# Patient Record
Sex: Male | Born: 1990 | Hispanic: No | Marital: Single | Smoking: Former smoker
Health system: Southern US, Community
[De-identification: ages and names within clinical notes are randomized; demographics above are authoritative.]

---

## 2009-03-05 ENCOUNTER — Emergency Department (HOSPITAL_COMMUNITY): Admission: EM | Admit: 2009-03-05 | Discharge: 2009-03-05 | Payer: Self-pay | Admitting: Emergency Medicine

## 2010-05-15 LAB — RAPID STREP SCREEN (MED CTR MEBANE ONLY): Streptococcus, Group A Screen (Direct): NEGATIVE

## 2010-05-15 LAB — GLUCOSE, CAPILLARY: Glucose-Capillary: 121 mg/dL — ABNORMAL HIGH (ref 70–99)

## 2013-06-30 ENCOUNTER — Emergency Department (HOSPITAL_COMMUNITY): Payer: Self-pay

## 2013-06-30 ENCOUNTER — Emergency Department (HOSPITAL_COMMUNITY)
Admission: EM | Admit: 2013-06-30 | Discharge: 2013-06-30 | Disposition: A | Discharge status: Home / Self Care | Payer: Self-pay | Attending: Emergency Medicine | Admitting: Emergency Medicine

## 2013-06-30 ENCOUNTER — Encounter (HOSPITAL_COMMUNITY): Payer: Self-pay | Admitting: Emergency Medicine

## 2013-06-30 DIAGNOSIS — Y939 Activity, unspecified: Secondary | ICD-10-CM | POA: Insufficient documentation

## 2013-06-30 DIAGNOSIS — W268XXA Contact with other sharp object(s), not elsewhere classified, initial encounter: Secondary | ICD-10-CM | POA: Insufficient documentation

## 2013-06-30 DIAGNOSIS — Y929 Unspecified place or not applicable: Secondary | ICD-10-CM | POA: Insufficient documentation

## 2013-06-30 DIAGNOSIS — L02519 Cutaneous abscess of unspecified hand: Secondary | ICD-10-CM | POA: Insufficient documentation

## 2013-06-30 DIAGNOSIS — L02511 Cutaneous abscess of right hand: Secondary | ICD-10-CM

## 2013-06-30 DIAGNOSIS — S61209A Unspecified open wound of unspecified finger without damage to nail, initial encounter: Secondary | ICD-10-CM | POA: Insufficient documentation

## 2013-06-30 DIAGNOSIS — L03019 Cellulitis of unspecified finger: Secondary | ICD-10-CM

## 2013-06-30 DIAGNOSIS — R509 Fever, unspecified: Secondary | ICD-10-CM | POA: Insufficient documentation

## 2013-06-30 DIAGNOSIS — F17200 Nicotine dependence, unspecified, uncomplicated: Secondary | ICD-10-CM | POA: Insufficient documentation

## 2013-06-30 DIAGNOSIS — Z23 Encounter for immunization: Secondary | ICD-10-CM | POA: Insufficient documentation

## 2013-06-30 MED ORDER — CEPHALEXIN 250 MG PO CAPS
500.0000 mg | ORAL_CAPSULE | Freq: Once | ORAL | Status: AC
  Administered 2013-06-30: 500 mg via ORAL
  Filled 2013-06-30: qty 2

## 2013-06-30 MED ORDER — OXYCODONE-ACETAMINOPHEN 5-325 MG PO TABS: 2.0000 | ORAL_TABLET | Freq: Three times a day (TID) | ORAL | Status: DC | PRN

## 2013-06-30 MED ORDER — CEPHALEXIN 500 MG PO CAPS: 500.0000 mg | ORAL_CAPSULE | Freq: Four times a day (QID) | ORAL | Status: AC

## 2013-06-30 MED ORDER — TETANUS-DIPHTH-ACELL PERTUSSIS 5-2.5-18.5 LF-MCG/0.5 IM SUSP
0.5000 mL | Freq: Once | INTRAMUSCULAR | Status: AC
  Administered 2013-06-30: 0.5 mL via INTRAMUSCULAR
  Filled 2013-06-30: qty 0.5

## 2013-06-30 MED ORDER — SULFAMETHOXAZOLE-TRIMETHOPRIM 800-160 MG PO TABS: 1.0000 | ORAL_TABLET | Freq: Two times a day (BID) | ORAL | Status: AC

## 2013-06-30 MED ORDER — OXYCODONE-ACETAMINOPHEN 5-325 MG PO TABS
1.0000 | ORAL_TABLET | Freq: Once | ORAL | Status: AC
  Administered 2013-06-30: 1 via ORAL
  Filled 2013-06-30: qty 1

## 2013-06-30 MED ORDER — SULFAMETHOXAZOLE-TMP DS 800-160 MG PO TABS
1.0000 | ORAL_TABLET | Freq: Once | ORAL | Status: AC
  Administered 2013-06-30: 1 via ORAL
  Filled 2013-06-30: qty 1

## 2013-06-30 NOTE — ED Notes (Signed)
Patient reports he cut his right thumb 2 weeks ago with a metal scrubbing tool.  Patient states the thumb became more swollen and painful x 2 day.  He tried to open the wound, cut it and reports large amount of puss came out.  Patient thumb is swollen.  There is no active bleeding/drainage noted.  Patient states he can feel his heart beating in his thumb.  Patient was not seen for the initial injury.  Patient last tetenus was reported to uncertain

## 2013-06-30 NOTE — ED Provider Notes (Signed)
CSN: [id number]     Arrival date & time 06/30/13  1122 History  This chart was scribed for non-physician practitioner working with Quita SkyeMichael Ghim, MD, by Jarvis Morganaylor Ferguson, ED Scribe. This patient was seen in room TR08C/TR08C and the patient's care was started at 3:19 PM.    Chief Complaint  Patient presents with  . Finger Injury      The history is provided by the patient. No language interpreter was used.   HPI Comments: Tony Rangel is a 23 y.o. male who presents to the Emergency Department complaining of an injury to his right thumb. Patient states that he cut his right thumb two weeks ago with a metal scrubbing tool that is used to clean dishes. Patient states that the pain became swollen and painful two days ago. Patient states that yesterday he cut open the wound with a razorblade and he reports that pus drained from his right thumb. Patient states that there is no active bleeding or drainage of his right thumb at this time. Unable to bend his thumb. Patient states he has associated swelling and a subjective fever. Patient states that he has taken some Ibuprofen for the pain with mild relief. Patient state that he does not know when his last Tetanus shot was administered.  Denies numbness.     History reviewed. No pertinent past medical history. History reviewed. No pertinent past surgical history. No family history on file. History  Substance Use Topics  . Smoking status: Current Every Day Smoker  . Smokeless tobacco: Not on file  . Alcohol Use: Yes    Review of Systems  Constitutional: Positive for fever (subjective).  Skin: Positive for wound.       Swelling and pain in right thumb  All other systems reviewed and are negative.     Allergies  Review of patient's allergies indicates no known allergies.  Home Medications   Prior to Admission medications   Medication Sig Start Date End Date Taking? Authorizing Provider  ibuprofen (ADVIL,MOTRIN) 200 MG tablet Take 200 mg by  mouth every 6 (six) hours as needed for mild pain.   Yes Historical Provider, MD   Triage Vitals: BP 116/73  Pulse 56  Temp(Src) 97.9 F (36.6 C) (Oral)  Resp 18  Ht 5\' 8"  (1.727 m)  Wt 151 lb (68.493 kg)  BMI 22.96 kg/m2  SpO2 99%  Physical Exam  Nursing note and vitals reviewed. Constitutional: He appears well-developed and well-nourished. No distress.  HENT:  Head: Normocephalic and atraumatic.  Neck: Neck supple.  Pulmonary/Chest: Effort normal.  Musculoskeletal:  Pt unable to bend right 1st interphalangeal joint (see skin exam)  Capillary refill < 2 seconds.  Sensation intact. Pt able to full move 1st  MCP.  Neurological: He is alert.  Skin: He is not diaphoretic.  Right thumb edematous with overlying erythema of the interphalangeal joint Laceration with scabbing over palmer aspect of interphalangeal joint    ED Course  Procedures (including critical care time)  DIAGNOSTIC STUDIES: Oxygen Saturation is 99% on RA, normal by my interpretation.    COORDINATION OF CARE: 3:26 PM- Will order Tdap injection. Will order consult for surgery on his right thumb.     Labs Review Labs Reviewed - No data to display  Imaging Review Dg Finger Thumb Right  06/30/2013   CLINICAL DATA:  Traumatic injury with pain  EXAM: RIGHT THUMB 2+V  COMPARISON:  None.  FINDINGS: Soft tissue laceration is noted. No underlying bony abnormality is seen.  Electronically Signed   By: Alcide CleverMark  Lukens M.D.   On: 06/30/2013 13:30     EKG Interpretation None      DIGITAL BLOCK Performed by: Trixie DredgeEmily Malvin Morrish Authorized by: Trixie DredgeEmily Pleshette Tomasini Consent: Verbal consent obtained. Risks and benefits: risks, benefits and alternatives were discussed Consent given by: patient Patient identity confirmed: provided demographic data Prepped and Draped in normal sterile fashion    Anesthesia: digital block, right thumb  Local anesthetic: lidocaine 2% no epinephrine  Anesthetic total: 7 ml  Patient tolerance: Patient  tolerated the procedure well with no immediate complications.   MDM   Final diagnoses:  Abscess of finger of right hand   Afebrile nontoxic right handed atient with infection of right thumb at IP joint.  Digital block performed by me after consultation with Dr Izora Ribasoley, who came to ED to see patient and possibly I&D thumb.  Marissa Sciacca, PA-C, made aware of patient at change of shift.     I personally performed the services described in this documentation, which was scribed in my presence. The recorded information has been reviewed and is accurate.     Trixie Dredgemily Birgit Nowling, PA-C 06/30/13 1719

## 2013-06-30 NOTE — ED Provider Notes (Signed)
[facility] at change in shift. Discussed case in great detail with family last, PA-C.  5:33 PM Patient seen and assessed by Dr. Izora Ribasoley where hand was drained from abscess. Physician recommended a round of antibiotics to be administered while in the ED setting. Recommended that patient be discharged with pain medications, antibiotics. Close followup. Patient was instructed by physician and if symptoms are to worsen or change by Wednesday patient is to be seen and reassessed.  Antibiotics by mouth were administered while in ED setting. Patient stable, afebrile. Patient had procedure performed by hand specialist while in ED setting-patient tolerated the procedures well. Discharged patient with pain medications, antibiotics. Discussed with patient proper care. Discussed with patient to closely monitor symptoms and if symptoms are to worsen or change to report back to the ED - strict return instructions given. Strict return instructions were given. Patient agreed to plan of care, understood, all questions answered.   Medications  cephALEXin (KEFLEX) capsule 500 mg (not administered)  sulfamethoxazole-trimethoprim (BACTRIM DS) 800-160 MG per tablet 1 tablet (not administered)  oxyCODONE-acetaminophen (PERCOCET/ROXICET) 5-325 MG per tablet 1 tablet (1 tablet Oral Given 06/30/13 1200)  Tdap (BOOSTRIX) injection 0.5 mL (0.5 mLs Intramuscular Given 06/30/13 1528)   Filed Vitals:   06/30/13 1154  BP: 116/73  Pulse: 56  Temp: 97.9 F (36.6 C)  TempSrc: Oral  Resp: 18  Height: 5\' 8"  (1.727 m)  Weight: 151 lb (68.493 kg)  SpO2: 99%   Diagnoses that have been ruled out:  None  Diagnoses that are still under consideration:  None  Final diagnoses:  Abscess of finger of right hand     Tony MuttonMarissa Janille Draughon, PA-C 07/01/13 1205

## 2013-06-30 NOTE — Consult Note (Signed)
Reason for Consult:infection L thumb Referring Physician: ER  CC:I cut my finger  HPI:  Tony Rangel is an 23 y.o. right handed male who presents with sweling, pain, since laceration ~2wks ago, states got better, then worse recently, pt drained thumb last pm - pus       .   Pain is rated at   8 /10 and is described as sharp/dull.  Pain is constant.  Pain is made better by rest/immobilization, worse with motion.   Associated signs/symptoms:  Denies fever Previous treatment:    History reviewed. No pertinent past medical history.  History reviewed. No pertinent past surgical history.  No family history on file.  Social History:  reports that he has been smoking.  He does not have any smokeless tobacco history on file. He reports that he drinks alcohol. He reports that he does not use illicit drugs.  Allergies: No Known Allergies  Medications: I have reviewed the patient's current medications.  No results found for this or any previous visit (from the past 48 hour(s)).  Dg Finger Thumb Right  06/30/2013   CLINICAL DATA:  Traumatic injury with pain  EXAM: RIGHT THUMB 2+V  COMPARISON:  None.  FINDINGS: Soft tissue laceration is noted. No underlying bony abnormality is seen.   Electronically Signed   By: Alcide CleverMark  Lukens M.D.   On: 06/30/2013 13:30    Pertinent items are noted in HPI. Temp:  [97.9 F (36.6 C)] 97.9 F (36.6 C) (05/04 1154) Pulse Rate:  [56] 56 (05/04 1154) Resp:  [18] 18 (05/04 1154) BP: (116)/(73) 116/73 mmHg (05/04 1154) SpO2:  [99 %] 99 % (05/04 1154) Weight:  [68.493 kg (151 lb)] 68.493 kg (151 lb) (05/04 1154) General appearance: alert and cooperative Resp: clear to auscultation bilaterally Cardio: regular rate and rhythm GI: soft, non-tender; bowel sounds normal; no masses,  no organomegaly Extremities: extremities normal, atraumatic, no cyanosis or edema  Except for l thumb with clsoed laceration of volar L thumb at ipj, swelling extends dorsally, ? Early  tenosynovitis   Assessment: Cellulitis, abscess, ?tenosynovitis L thumb Plan: Thumb anesthetized, old lac opened, purulent material drained, wound irrigated, packed with iodoform gauze, instructions for wound care given, pt to follow with in 48 hours, must take oral abx. I have discussed this treatment plan in detail with patient and/or family, including the risks of the recommended treatment or surgery, the benefits and the alternatives.  The patient and/or caregiver understands that additional treatment may be necessary.  Thuan Tippett 06/30/2013, 5:24 PM

## 2013-06-30 NOTE — Discharge Instructions (Signed)
Please rest and stay hydrated Please take antibiotics as prescribed and on a full stomach Please take pain medications as prescribed. While on pain medications his be no drinking alcohol, driving, operating any heavy machinery if there is extra please disposer proper manner. While on pain medications please do not take any extra Tylenol for this can lead to Tylenol overdose and liver failure. Please closely monitor the wound and the wound is to worsen or change - bleeding, swelling, red streaks running up the finger or arm, numbness, tingling, fall, injury, discharge - please report back to the ED immediately. If symptoms are to worsen or change by Wednesday, Jul 02 2013 please report back to the ED or call Dr. Debby Bud office   Cellulitis Cellulitis is an infection of the skin and the tissue beneath it. The infected area is usually red and tender. Cellulitis occurs most often in the arms and lower legs.  CAUSES  Cellulitis is caused by bacteria that enter the skin through cracks or cuts in the skin. The most common types of bacteria that cause cellulitis are Staphylococcus and Streptococcus. SYMPTOMS   Redness and warmth.  Swelling.  Tenderness or pain.  Fever. DIAGNOSIS  Your caregiver can usually determine what is wrong based on a physical exam. Blood tests may also be done. TREATMENT  Treatment usually involves taking an antibiotic medicine. HOME CARE INSTRUCTIONS   Take your antibiotics as directed. Finish them even if you start to feel better.  Keep the infected arm or leg elevated to reduce swelling.  Apply a warm cloth to the affected area up to 4 times per day to relieve pain.  Only take over-the-counter or prescription medicines for pain, discomfort, or fever as directed by your caregiver.  Keep all follow-up appointments as directed by your caregiver. SEEK MEDICAL CARE IF:   You notice red streaks coming from the infected area.  Your red area gets larger or turns dark  in color.  Your bone or joint underneath the infected area becomes painful after the skin has healed.  Your infection returns in the same area or another area.  You notice a swollen bump in the infected area.  You develop new symptoms. SEEK IMMEDIATE MEDICAL CARE IF:   You have a fever.  You feel very sleepy.  You develop vomiting or diarrhea.  You have a general ill feeling (malaise) with muscle aches and pains. MAKE SURE YOU:   Understand these instructions.  Will watch your condition.  Will get help right away if you are not doing well or get worse. Document Released: 11/23/2004 Document Revised: 08/15/2011 Document Reviewed: 05/01/2011 [facility] Patient Information 2014 [place], [place].   Emergency Department Resource Guide 1) Find a Doctor and Pay Out of Pocket Although you won't have to find out who is covered by your insurance plan, it is a good idea to ask around and get recommendations. You will then need to call the office and see if the doctor you have chosen will accept you as a new patient and what types of options they offer for patients who are self-pay. Some doctors offer discounts or will set up payment plans for their patients who do not have insurance, but you will need to ask so you aren't surprised when you get to your appointment.  2) Contact Your Local Health Department Not all health departments have doctors that can see patients for sick visits, but many do, so it is worth a call to see if yours does. If you  don't know where your local health department is, you can check in your phone book. The CDC also has a tool to help you locate your state's health department, and many state websites also have listings of all of their local health departments.  3) Find a Walk-in Clinic If your illness is not likely to be very severe or complicated, you may want to try a walk in clinic. These are popping up all over the country in pharmacies, drugstores, and shopping  centers. They're usually staffed by nurse practitioners or physician assistants that have been trained to treat common illnesses and complaints. They're usually fairly quick and inexpensive. However, if you have serious medical issues or chronic medical problems, these are probably not your best option.  No Primary Care Doctor: - Call Health Connect at  [phone] - they can help you locate a primary care doctor that  accepts your insurance, provides certain services, etc. - Physician Referral Service- [phone]  Chronic Pain Problems: Organization         Address  Phone   Notes  [facility]  [phone] Patients need to be referred by their primary care doctor.   Medication Assistance: Organization         Address  Phone   Notes  [facility] Med[facility] [address] [place], [place] [address] [phone] --Must be a resident of [facility] -- Must have NO insurance coverage whatsoever (no Medicaid/ Medicare, etc.) -- The pt. MUST have a primary care doctor that directs their care regularly and follows them in the community   [facility]  [phone]   [employer]  [phone]    Agencies that provide inexpensive medical care: Organization         Address  Phone   Notes  [facility]  [phone]   [facility]    [phone]   [facility] [address] [place], [place] [address] [phone]   [facility] [address] [place] [place] [phone]   Planned Parenthood    [phone]   [facility]    [phone]   [facility]  [address], [place] Phone:  [phone], Fax:  [phone] Hours of Operation:  9 am - 6 pm, M-F.  Also accepts Medicaid/Medicare and self-pay.  [facility]  [address], [place] Phone: [phone], Fax: [phone]. Hours of Operation:  8:30 am - 5:30 pm, M-F.  Also accepts Medicaid and self-pay.  [facility] [place] [address], [place] Phone: [phone]   [facility] [address] Natasha Bence [place], [place] [phone], Ext. [phone] Mondays & Thursdays: 7-9 AM.  First 15 patients are seen on a first come, first serve basis.    Medicaid-accepting [facility] Providers:  Organization         Address  Phone   Notes  [facility] [address], [place] [phone] Also accepts self-pay patients.  [facility] [address] Laurell Josephs [place], [place]  [phone]   [facility] [address], [place] [phone]   [facility] [address] [place] [phone]   Renaye Rakers [address], [place]   [phone] Only accepts [place]  Access Medicaid patients after they have their name applied to their card.   Self-Pay (no insurance) in [facility]:  Organization         Address  Phone   Notes  Sickle Cell Patients, [facility] [address] [place], [place] [phone]   [facility] [address] [place], [place] [phone]   [facility] [place]  [address], [place] [phone]   [facility]/Dr. Osei-Bonsu  [address], [place] or [address], [place] [phone] Phone number for both [place] and [place] locations is the same.  Urgent Medical and [facility] [address], [place] [phone]   [facility] [address] [place] or [address] [phone]   [facility] [address], [place] [phone], phone; [phone], fax Sees patients 1st and 3rd Saturday of every month.  Must not qualify for public or private insurance (i.e.  Medicaid, Medicare, [place] Health Choice, Veterans' Benefits)  Household income should be no more than 200% of the poverty level The clinic cannot treat you if you are pregnant or think you are pregnant  Sexually transmitted diseases are not treated at the clinic.    Dental Care: Organization         Address  Phone  Notes  [facility] Department of [facility] [address] [place], [place] [phone] Accepts children up to age 31 who are enrolled in [place] or [place] Health Choice; pregnant women with a Medicaid card; and children who have applied for Medicaid or [place] Health Choice, but were declined, whose parents can pay a reduced fee at time of service.  [facility] Department of [facility]  [address], [place] [phone] Accepts children up to age 33 who are enrolled in [place] or [place] Health Choice; pregnant women with a Medicaid card; and children who have applied for Medicaid or [place] Health Choice, but were declined, whose parents can pay a reduced fee at time of service.  [facility]  [address] [place], [place] [phone] Patients are seen by appointment only. Walk-ins are not accepted. [facility] will see patients 54 years of age and older. Monday - Tuesday (8am-5pm) Most Wednesdays (8:30-5pm) $30 per visit, cash only  [facility] Adult Dental Access PROGRAM  [address], [facility] [phone] Patients are seen by appointment only. Walk-ins are not accepted. [facility] will see patients 81 years of age and older. One Wednesday Evening (Monthly: Volunteer Based).  $30 per visit, cash only  [employer]  [phone] for adults; Children under age 3, call Graduate Pediatric Dentistry at [phone]. Children aged 64-14, please call [phone] to request a pediatric application.  Dental services are provided in all areas of dental care including fillings,  crowns and bridges, complete and partial dentures, implants, gum treatment, root canals, and extractions. Preventive care is also provided. Treatment is provided to both adults and children. Patients are selected via a lottery and there is often a waiting list.   [facility] [address], [place]  [phone] [email or web address]   [facility] [address] [place], [place] [phone], Ext. [phone] Second and Fourth Thursday of each month, opens at 6:30 AM; Clinic ends at 9  AM.  Patients are seen on a first-come first-served basis, and a limited number are seen during each clinic.   [facility]  [address], [place], [place] [phone]   Eligibility Requirements You must have lived in [place], [place], or [place] counties for at least the last three months.   You cannot be eligible for state or federal sponsored National Cityhealthcare insurance, including CIG[employer]eterans Administration, [place]icaid, or [employer]icare.   You generally cannot be eligible for healthcare insurance through your employer.    How to apply: Eligibility screenings are held every Tuesday and Wednesday afternoon from 1:00 pm until 4:00 pm. You do not need an appointment for the interview!  [facility] [address], [place], [place] [phone]   [facility]  [phone]   [facility]  [phone]   [facility]  [phone]    Behavioral Health Resources in the Community: Intensive Outpatient Programs Organization         Address  Phone  Notes  [facility] [address], [place], [place] [phone]   [facility] [address], [place], [place] [phone]   [facility] [address], [place], [place]  [phone]   [facility] [address],  [place], [place] [phone] or [phone]   Substance Abuse  Resources Organization         Address  Phone  Notes  [facility]  [phone]   [facility]  [phone]   [facility]  [phone]   [facility]  [phone]   Residential & Outpatient Substance Abuse Program  [phone]   Psychological Services Organization         Address  Phone  Notes  [facility]  [phone]- [phone]   [facility]  [phone]- [phone]   [facility] [address], [place] [phone] or [phone]    Mobile Crisis Teams Organization         Address  Phone  Notes  [facility], Mobile Crisis Care Unit  [phone]   [facility]  [address] [place], [place] [phone]   Doristine LocksSharon DeEsch [address] [place] [phone]    Self-Help/Support Groups Organization         Address  Phone             Notes  [facility] [place] - variety of support groups  336- [phone] Call for more information  Narcotics Anonymous (NA), [facility] [address], [place]  2 meetings at this location   Statisticianesidential Treatment Programs Organization         Address  Phone  Notes  [facility] [address],    [place]  [phone]   [facility]  [address], [place] [phone], [place], [place] [phone]   [facility] [address], [place] [phone] Admissions: 8am-3pm M-F  [facility] [address]    [place], [place] [phone]   [facility] [address], [place], [place] [phone]   [facility] [address]  [place], [place] [phone]   [facility] - Intensive Outpatient [address], [place], [place] [phone]   [facility] ([facility] [address]  [place], [place] [phone] or [phone]   [facility] ([facility])  [address] [place], [place] [phone] Accepts Medicaid  [facility] [address]  [place] [phone] Substance Abuse/Addiction Treatment   [facility] Resources Organization         Address  Phone  Notes  [facility]  [phone]) [phone]   Angie FavaJulie Brannon, PhD [address] [place], [place]   [phone] or [phone]   [facility]   [address] [place] [address] [place], [place] [phone]   [facility] [address], [place], [place] [phone] Insurance/Medicaid/sponsorship through [facility]  [facility] Families [address]                                    [place], [place] [phone] Therapy/tele-psych/case  [facility] [address].   [place], [place] [phone](336) [phone]    Dr. Lolly MustacheArfeen  [phone]   [facility]  [employer] [facility] 1) [address], [place] 2) [address], [place] 3)  [address], [place] [phone]) [phone]) [phone]  [phone]   [facility] Child Abuse Hotline [phone](336) [phone] or [phone](336) [phone] (After Hours)

## 2013-06-30 NOTE — ED Notes (Signed)
Hand surgeon at bedside. Dr. Izora Ribasoley.

## 2013-06-30 NOTE — ED Notes (Signed)
PA at bedside.

## 2013-07-03 NOTE — ED Provider Notes (Signed)
Medical screening examination/treatment/procedure(s) were performed by non-physician practitioner and as supervising physician I was immediately available for consultation/collaboration.  Gavin PoundMichael Y. Verlene Glantz, MD 07/03/13 2251

## 2013-10-22 ENCOUNTER — Encounter (HOSPITAL_COMMUNITY): Payer: Self-pay | Admitting: Emergency Medicine

## 2013-10-22 ENCOUNTER — Emergency Department (HOSPITAL_COMMUNITY)
Admission: EM | Admit: 2013-10-22 | Discharge: 2013-10-22 | Disposition: A | Discharge status: Home / Self Care | Payer: Self-pay | Attending: Emergency Medicine | Admitting: Emergency Medicine

## 2013-10-22 DIAGNOSIS — J020 Streptococcal pharyngitis: Secondary | ICD-10-CM | POA: Insufficient documentation

## 2013-10-22 DIAGNOSIS — Z79899 Other long term (current) drug therapy: Secondary | ICD-10-CM | POA: Insufficient documentation

## 2013-10-22 DIAGNOSIS — B9789 Other viral agents as the cause of diseases classified elsewhere: Secondary | ICD-10-CM | POA: Insufficient documentation

## 2013-10-22 DIAGNOSIS — F17200 Nicotine dependence, unspecified, uncomplicated: Secondary | ICD-10-CM | POA: Insufficient documentation

## 2013-10-22 DIAGNOSIS — R509 Fever, unspecified: Secondary | ICD-10-CM | POA: Insufficient documentation

## 2013-10-22 DIAGNOSIS — B349 Viral infection, unspecified: Secondary | ICD-10-CM

## 2013-10-22 DIAGNOSIS — R197 Diarrhea, unspecified: Secondary | ICD-10-CM | POA: Insufficient documentation

## 2013-10-22 LAB — CBC WITH DIFFERENTIAL/PLATELET
BASOS ABS: 0 10*3/uL (ref 0.0–0.1)
Basophils Relative: 0 % (ref 0–1)
EOS ABS: 0.1 10*3/uL (ref 0.0–0.7)
EOS PCT: 2 % (ref 0–5)
HEMATOCRIT: 45.5 % (ref 39.0–52.0)
Hemoglobin: 15.6 g/dL (ref 13.0–17.0)
LYMPHS PCT: 40 % (ref 12–46)
Lymphs Abs: 2.9 10*3/uL (ref 0.7–4.0)
MCH: 31 pg (ref 26.0–34.0)
MCHC: 34.3 g/dL (ref 30.0–36.0)
MCV: 90.5 fL (ref 78.0–100.0)
MONO ABS: 0.5 10*3/uL (ref 0.1–1.0)
Monocytes Relative: 7 % (ref 3–12)
Neutro Abs: 3.6 10*3/uL (ref 1.7–7.7)
Neutrophils Relative %: 51 % (ref 43–77)
PLATELETS: 266 10*3/uL (ref 150–400)
RBC: 5.03 MIL/uL (ref 4.22–5.81)
RDW: 12.7 % (ref 11.5–15.5)
WBC: 7.1 10*3/uL (ref 4.0–10.5)

## 2013-10-22 LAB — URINALYSIS, ROUTINE W REFLEX MICROSCOPIC
Bilirubin Urine: NEGATIVE
Glucose, UA: NEGATIVE mg/dL
Hgb urine dipstick: NEGATIVE
Ketones, ur: NEGATIVE mg/dL
LEUKOCYTES UA: NEGATIVE
Nitrite: NEGATIVE
PROTEIN: NEGATIVE mg/dL
SPECIFIC GRAVITY, URINE: 1.014 (ref 1.005–1.030)
UROBILINOGEN UA: 0.2 mg/dL (ref 0.0–1.0)
pH: 5.5 (ref 5.0–8.0)

## 2013-10-22 LAB — COMPREHENSIVE METABOLIC PANEL
ALT: 18 U/L (ref 0–53)
AST: 24 U/L (ref 0–37)
Albumin: 4.1 g/dL (ref 3.5–5.2)
Alkaline Phosphatase: 60 U/L (ref 39–117)
Anion gap: 15 (ref 5–15)
BUN: 19 mg/dL (ref 6–23)
CALCIUM: 9.3 mg/dL (ref 8.4–10.5)
CO2: 23 meq/L (ref 19–32)
CREATININE: 0.99 mg/dL (ref 0.50–1.35)
Chloride: 105 mEq/L (ref 96–112)
Glucose, Bld: 112 mg/dL — ABNORMAL HIGH (ref 70–99)
Potassium: 4.1 mEq/L (ref 3.7–5.3)
SODIUM: 143 meq/L (ref 137–147)
TOTAL PROTEIN: 6.8 g/dL (ref 6.0–8.3)
Total Bilirubin: 0.3 mg/dL (ref 0.3–1.2)

## 2013-10-22 MED ORDER — AMOXICILLIN 500 MG PO CAPS: 500.0000 mg | ORAL_CAPSULE | Freq: Three times a day (TID) | ORAL | Status: AC

## 2013-10-22 MED ORDER — PROMETHAZINE HCL 25 MG PO TABS: 25.0000 mg | ORAL_TABLET | Freq: Four times a day (QID) | ORAL | Status: DC | PRN

## 2013-10-22 MED ORDER — DIPHENOXYLATE-ATROPINE 2.5-0.025 MG PO TABS: 1.0000 | ORAL_TABLET | Freq: Four times a day (QID) | ORAL | Status: AC | PRN

## 2013-10-22 NOTE — ED Notes (Addendum)
Per pt sts since this am he has had diarrhea 3 time. sts also fever, body aches, cough and sore throat. sts he took tylenol this am and helped a little.

## 2013-10-22 NOTE — Discharge Instructions (Signed)
Take amoxicillin as directed until gone. Take lomotil for diarrhea. Take phenergan as needed for nausea. Refer to attached documents for more information.

## 2013-10-22 NOTE — ED Provider Notes (Signed)
CSN: [id number]     Arrival date & time 10/22/13  1308 History  This chart was scribed for non-physician practitioner, Emilia Beck, PA-C working with Audree Camel, MD by Greggory Stallion, ED scribe. This patient was seen in room C27C/C27C and the patient's care was started at 3:47 PM.   Chief Complaint  Patient presents with  . Fever  . Diarrhea  . Sore Throat   The history is provided by the patient. No language interpreter was used.   HPI Comments: Tony Rangel is a 23 y.o. male who presents to the Emergency Department complaining of gradual onset sore throat and mild cough that started this morning. Reports associated generalized body aches, nausea, emesis and diarrhea that started this morning as well. States he has had 3 episodes of diarrhea. Pt has taken tylenol and robitussin with some relief of fever and cough. Denies abdominal pain. States his children were recently sick.   History reviewed. No pertinent past medical history. History reviewed. No pertinent past surgical history. History reviewed. No pertinent family history. History  Substance Use Topics  . Smoking status: Current Every Day Smoker  . Smokeless tobacco: Not on file  . Alcohol Use: Yes    Review of Systems  Constitutional: Positive for fever.  HENT: Positive for sore throat.   Respiratory: Positive for cough.   Gastrointestinal: Positive for nausea, vomiting and diarrhea. Negative for abdominal pain.   Allergies  Review of patient's allergies indicates no known allergies.  Home Medications   Prior to Admission medications   Medication Sig Start Date End Date Taking? Authorizing Provider  cephALEXin (KEFLEX) 500 MG capsule Take 1 capsule (500 mg total) by mouth 4 (four) times daily. 06/30/13   Marissa Sciacca, PA-C  ibuprofen (ADVIL,MOTRIN) 200 MG tablet Take 200 mg by mouth every 6 (six) hours as needed for mild pain.    Historical Provider, MD  oxyCODONE-acetaminophen (PERCOCET/ROXICET) 5-325 MG  per tablet Take 2 tablets by mouth every 8 (eight) hours as needed for severe pain. 06/30/13   Marissa Sciacca, PA-C   BP 118/65  Pulse 61  Temp(Src) 97.9 F (36.6 C) (Oral)  Resp 18  Ht  (1.753 m)  Wt 155 lb (70.308 kg)  BMI 22.88 kg/m2  SpO2 98%  Physical Exam  Nursing note and vitals reviewed. Constitutional: He is oriented to person, place, and time. He appears well-developed and well-nourished. No distress.  HENT:  Head: Normocephalic and atraumatic.  Right Ear: Tympanic membrane and ear canal normal.  Left Ear: Tympanic membrane and ear canal normal.  Nose: Nose normal.  Mouth/Throat: Oropharyngeal exudate, posterior oropharyngeal edema and posterior oropharyngeal erythema present.  Eyes: Conjunctivae and EOM are normal.  Neck: Neck supple. No tracheal deviation present.  Cardiovascular: Normal rate, regular rhythm and normal heart sounds.   Pulmonary/Chest: Effort normal and breath sounds normal. No respiratory distress. He has no wheezes. He has no rales.  Abdominal: Soft. There is no tenderness.  Musculoskeletal: Normal range of motion.  Lymphadenopathy:    He has cervical adenopathy.  Neurological: He is alert and oriented to person, place, and time.  Skin: Skin is warm and dry.  Psychiatric: He has a normal mood and affect. His behavior is normal.    ED Course  Procedures (including critical care time)  DIAGNOSTIC STUDIES: Oxygen Saturation is 98% on RA, normal by my interpretation.    COORDINATION OF CARE: 3:50 PM-Discussed treatment plan which includes amoxicillin, imodium and zofran with pt at bedside and pt agreed  to plan.   Labs Review Labs Reviewed  COMPREHENSIVE METABOLIC PANEL - Abnormal; Notable for the following:    Glucose, Bld 112 (*)    All other components within normal limits  CBC WITH DIFFERENTIAL  URINALYSIS, ROUTINE W REFLEX MICROSCOPIC    Imaging Review No results found.   EKG Interpretation None      MDM   Final  diagnoses:  Strep throat  Viral illness    4:30 PM Labs unremarkable. Patient will be treated for strep throat and provided with lomotil and phenergan for symptoms. Vitals stable and patient afebrile.   I personally performed the services described in this documentation, which was scribed in my presence. The recorded information has been reviewed and is accurate.  Emilia Beck, [place] 10/22/13 [phone]

## 2013-10-23 NOTE — ED Provider Notes (Signed)
Medical screening examination/treatment/procedure(s) were performed by non-physician practitioner and as supervising physician I was immediately available for consultation/collaboration.   EKG Interpretation None        Willoughby Doell T Tiarna Koppen, MD 10/23/13 1646 

## 2013-12-30 ENCOUNTER — Emergency Department (HOSPITAL_COMMUNITY): Payer: No Typology Code available for payment source

## 2013-12-30 ENCOUNTER — Emergency Department (HOSPITAL_COMMUNITY)
Admission: EM | Admit: 2013-12-30 | Discharge: 2013-12-30 | Disposition: A | Discharge status: Home / Self Care | Payer: No Typology Code available for payment source | Attending: Emergency Medicine | Admitting: Emergency Medicine

## 2013-12-30 ENCOUNTER — Encounter (HOSPITAL_COMMUNITY): Payer: Self-pay | Admitting: Emergency Medicine

## 2013-12-30 DIAGNOSIS — Y9389 Activity, other specified: Secondary | ICD-10-CM | POA: Insufficient documentation

## 2013-12-30 DIAGNOSIS — S4992XA Unspecified injury of left shoulder and upper arm, initial encounter: Secondary | ICD-10-CM | POA: Insufficient documentation

## 2013-12-30 DIAGNOSIS — Z79899 Other long term (current) drug therapy: Secondary | ICD-10-CM | POA: Insufficient documentation

## 2013-12-30 DIAGNOSIS — Y92410 Unspecified street and highway as the place of occurrence of the external cause: Secondary | ICD-10-CM | POA: Insufficient documentation

## 2013-12-30 DIAGNOSIS — S062X1A Diffuse traumatic brain injury with loss of consciousness of 30 minutes or less, initial encounter: Secondary | ICD-10-CM | POA: Insufficient documentation

## 2013-12-30 DIAGNOSIS — S199XXA Unspecified injury of neck, initial encounter: Secondary | ICD-10-CM | POA: Diagnosis present

## 2013-12-30 DIAGNOSIS — Z720 Tobacco use: Secondary | ICD-10-CM | POA: Insufficient documentation

## 2013-12-30 DIAGNOSIS — S0990XA Unspecified injury of head, initial encounter: Secondary | ICD-10-CM | POA: Insufficient documentation

## 2013-12-30 DIAGNOSIS — Z792 Long term (current) use of antibiotics: Secondary | ICD-10-CM | POA: Diagnosis not present

## 2013-12-30 MED ORDER — ONDANSETRON 8 MG PO TBDP: 8.0000 mg | ORAL_TABLET | Freq: Three times a day (TID) | ORAL | Status: AC | PRN

## 2013-12-30 MED ORDER — ONDANSETRON 8 MG PO TBDP
8.0000 mg | ORAL_TABLET | Freq: Once | ORAL | Status: AC
  Administered 2013-12-30: 8 mg via ORAL
  Filled 2013-12-30: qty 1

## 2013-12-30 MED ORDER — OXYCODONE-ACETAMINOPHEN 5-325 MG PO TABS: 1.0000 | ORAL_TABLET | Freq: Four times a day (QID) | ORAL | Status: AC | PRN

## 2013-12-30 MED ORDER — HYDROCODONE-ACETAMINOPHEN 5-325 MG PO TABS
2.0000 | ORAL_TABLET | Freq: Once | ORAL | Status: AC
  Administered 2013-12-30: 1 via ORAL
  Filled 2013-12-30: qty 1

## 2013-12-30 MED ORDER — HYDROCODONE-ACETAMINOPHEN 5-325 MG PO TABS
1.0000 | ORAL_TABLET | Freq: Once | ORAL | Status: AC
  Administered 2013-12-30: 1 via ORAL
  Filled 2013-12-30: qty 1
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
## 2013-12-30 NOTE — ED Notes (Signed)
Bed: WA06 Expected date:  Expected time:  Means of arrival:  Comments: Triage7 
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
## 2013-12-30 NOTE — ED Notes (Signed)
Pt c/o increased nausea. Medicated per order. Pt visiting with family. Pt is aware of need to stay for observation and neurologist consult.
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
## 2014-01-03 ENCOUNTER — Encounter (HOSPITAL_COMMUNITY): Payer: Self-pay | Admitting: *Deleted

## 2014-01-03 ENCOUNTER — Emergency Department (HOSPITAL_COMMUNITY)
Admission: EM | Admit: 2014-01-03 | Discharge: 2014-01-03 | Disposition: A | Discharge status: Home / Self Care | Payer: No Typology Code available for payment source | Attending: Emergency Medicine | Admitting: Emergency Medicine

## 2014-01-03 ENCOUNTER — Emergency Department (HOSPITAL_COMMUNITY): Payer: No Typology Code available for payment source

## 2014-01-03 DIAGNOSIS — S062X1D Diffuse traumatic brain injury with loss of consciousness of 30 minutes or less, subsequent encounter: Secondary | ICD-10-CM

## 2014-01-03 DIAGNOSIS — S0093XD Contusion of unspecified part of head, subsequent encounter: Secondary | ICD-10-CM | POA: Diagnosis not present

## 2014-01-03 DIAGNOSIS — R1110 Vomiting, unspecified: Secondary | ICD-10-CM | POA: Diagnosis not present

## 2014-01-03 DIAGNOSIS — S060X1D Concussion with loss of consciousness of 30 minutes or less, subsequent encounter: Secondary | ICD-10-CM | POA: Insufficient documentation

## 2014-01-03 DIAGNOSIS — S0990XD Unspecified injury of head, subsequent encounter: Secondary | ICD-10-CM | POA: Diagnosis present

## 2014-01-03 DIAGNOSIS — Z720 Tobacco use: Secondary | ICD-10-CM | POA: Insufficient documentation

## 2014-01-03 MED ORDER — MORPHINE SULFATE 4 MG/ML IJ SOLN
4.0000 mg | Freq: Once | INTRAMUSCULAR | Status: AC
  Administered 2014-01-03: 4 mg via INTRAVENOUS
  Filled 2014-01-03: qty 1

## 2014-01-03 MED ORDER — HYDROCODONE-ACETAMINOPHEN 5-325 MG PO TABS
1.0000 | ORAL_TABLET | Freq: Four times a day (QID) | ORAL | Status: DC | PRN
Start: 2014-01-03 — End: 2014-04-12

## 2014-01-03 MED ORDER — ONDANSETRON HCL 4 MG/2ML IJ SOLN
4.0000 mg | Freq: Once | INTRAMUSCULAR | Status: AC
  Administered 2014-01-03: 4 mg via INTRAVENOUS
  Filled 2014-01-03: qty 2

## 2014-01-03 MED ORDER — PROMETHAZINE HCL 25 MG PO TABS: 25.0000 mg | ORAL_TABLET | Freq: Three times a day (TID) | ORAL | Status: AC | PRN

## 2014-01-03 MED ORDER — FENTANYL CITRATE 0.05 MG/ML IJ SOLN
50.0000 ug | Freq: Once | INTRAMUSCULAR | Status: AC
  Administered 2014-01-03: 50 ug via INTRAVENOUS
  Filled 2014-01-03: qty 2

## 2014-01-03 MED ORDER — SODIUM CHLORIDE 0.9 % IV BOLUS (SEPSIS)
1000.0000 mL | Freq: Once | INTRAVENOUS | Status: AC
  Administered 2014-01-03: 1000 mL via INTRAVENOUS
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
## 2014-01-03 NOTE — Discharge Instructions (Signed)
Return here as needed. Follow up with the doctor provided. INcrease your fluid intake

## 2014-04-12 ENCOUNTER — Emergency Department (HOSPITAL_COMMUNITY)
Admission: EM | Admit: 2014-04-12 | Discharge: 2014-04-12 | Disposition: A | Discharge status: Home / Self Care | Payer: Self-pay | Attending: Emergency Medicine | Admitting: Emergency Medicine

## 2014-04-12 ENCOUNTER — Encounter (HOSPITAL_COMMUNITY): Payer: Self-pay | Admitting: Emergency Medicine

## 2014-04-12 DIAGNOSIS — Y92511 Restaurant or cafe as the place of occurrence of the external cause: Secondary | ICD-10-CM | POA: Insufficient documentation

## 2014-04-12 DIAGNOSIS — X58XXXA Exposure to other specified factors, initial encounter: Secondary | ICD-10-CM | POA: Insufficient documentation

## 2014-04-12 DIAGNOSIS — T6591XA Toxic effect of unspecified substance, accidental (unintentional), initial encounter: Secondary | ICD-10-CM

## 2014-04-12 DIAGNOSIS — Y93G1 Activity, food preparation and clean up: Secondary | ICD-10-CM | POA: Insufficient documentation

## 2014-04-12 DIAGNOSIS — T7849XA Other allergy, initial encounter: Secondary | ICD-10-CM | POA: Insufficient documentation

## 2014-04-12 DIAGNOSIS — Y990 Civilian activity done for income or pay: Secondary | ICD-10-CM | POA: Insufficient documentation

## 2014-04-12 DIAGNOSIS — Z87891 Personal history of nicotine dependence: Secondary | ICD-10-CM | POA: Insufficient documentation

## 2014-04-12 MED ORDER — PREDNISONE 20 MG PO TABS: 60.0000 mg | ORAL_TABLET | Freq: Every day | ORAL | Status: AC

## 2014-04-12 MED ORDER — DIPHENHYDRAMINE HCL 25 MG PO TABS: 25.0000 mg | ORAL_TABLET | Freq: Four times a day (QID) | ORAL | Status: AC | PRN

## 2014-04-12 MED ORDER — HYDROCODONE-ACETAMINOPHEN 5-325 MG PO TABS: 1.0000 | ORAL_TABLET | ORAL | Status: AC | PRN

## 2014-04-12 MED ORDER — DIPHENHYDRAMINE HCL 25 MG PO CAPS
25.0000 mg | ORAL_CAPSULE | Freq: Once | ORAL | Status: AC
  Administered 2014-04-12: 25 mg via ORAL
  Filled 2014-04-12: qty 1

## 2014-04-12 MED ORDER — PREDNISONE 20 MG PO TABS
60.0000 mg | ORAL_TABLET | Freq: Once | ORAL | Status: AC
  Administered 2014-04-12: 60 mg via ORAL
  Filled 2014-04-12: qty 3

## 2014-04-12 MED ORDER — HYDROCODONE-ACETAMINOPHEN 5-325 MG PO TABS
1.0000 | ORAL_TABLET | Freq: Once | ORAL | Status: AC
  Administered 2014-04-12: 1 via ORAL
  Filled 2014-04-12: qty 1
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
## 2014-04-12 NOTE — Discharge Instructions (Signed)
Chemical Burn Many chemicals can burn the skin. A chemical burn should be flushed with cool water and checked by an emergency caregiver. Your skin is a natural barrier to infection. It is the largest organ of your body. Burns damage this natural protection. To help prevent infection, it is very important to follow your caregiver's instructions in the care of your burn.  Many industrial chemicals may cause burns. These chemicals include acids, alkalis, and organic compounds such as petroleum, phenol, bitumen, tar, and grease. When acids come in contact with the skin, they cause an immediate change in the skin.Acid burns produce significant pain and form a scab (eschar). Usually, the immediate skin changes are the only damage from an acid burn.However, exposure to formic acid, chromic acid, or hydrofluoric acid may affect the whole body and may even be life-threatening. Alkalis include lye, cement, lime, and many chemicals with "hydroxide" in their name.An alkali burn may be less apparent than an acid burn at first. However, alkalis may cause greater tissue damage.It is important to be aware of any chemicals you are using. Treat any exposure to skin, eyes, or mucous membranes (nose, mouth, throat) as a potential emergency. PREVENTION  Avoid exposure to toxic chemicals that can cause burns.  Store chemicals out of the reach of children.  Use protective gloves when handling dangerous chemicals. HOME CARE INSTRUCTIONS   Wash your hands well before changing your bandage.  Change your bandage as often as directed by your caregiver.  Remove the old bandage. If the bandage sticks, you may soak it off with cool, clean water.  Cleanse the burn thoroughly but gently with mild soap and water.  Pat the area dry with a clean, dry cloth.  Apply a thin layer of antibacterial cream to the burn.  Apply a clean bandage as instructed by your caregiver.  Keep the bandage as clean and dry as  possible.  Elevate the affected area for the first 24 hours, then as instructed by your caregiver.  Only take over-the-counter or prescription medicines for pain, discomfort, or fever as directed by your caregiver.  Keep all follow-up appointments.This is important. This is how your caregiver can tell if your treatment is working. SEEK IMMEDIATE MEDICAL CARE IF:   You develop excessive pain.  You develop redness, tenderness, swelling, or red streaks near the burn.  The burned area develops yellowish-white fluid (pus) or a bad smell.  You have a fever. MAKE SURE YOU:   Understand these instructions.  Will watch your condition.  Will get help right away if you are not doing well or get worse. Document Released: 11/20/2003 Document Revised: 05/08/2011 Document Reviewed: 07/11/2010 Exit[employer] Patient Information 2015 [employer]. This information is not intended to replace advice given to you by your health care provider. Make sure you discuss any questions you have with your health care provider.  

## 2014-04-12 NOTE — ED Provider Notes (Signed)
CSN: [id number]     Arrival date & time 04/12/14  2204 History   First MD Initiated Contact with Patient 04/12/14 2217     Chief Complaint  Patient presents with  . Hand Injury     (Consider location/radiation/quality/duration/timing/severity/associated sxs/prior Treatment) Patient is a 24 y.o. male presenting with hand injury. No language interpreter was used.  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 He removed his gloves and found his hands had a red, painful rash to both hands. Pain has increased over time.    History reviewed. No pertinent past medical history. History reviewed. No pertinent past surgical history. History reviewed. No pertinent family history. History  Substance Use Topics  . Smoking status: Former Smoker    Quit date: 03/12/2014  . Smokeless tobacco: Never Used  . Alcohol Use: Yes    Review of Systems  Constitutional: Negative for fever and chills.  Musculoskeletal:       See HPI  Skin: Positive for color change and rash.  Neurological: Negative.  Negative for numbness.      Allergies  Review of patient's allergies indicates no known allergies.  Home Medications   Prior to Admission medications   Medication Sig Start Date End Date Taking? Authorizing Provider  ibuprofen (ADVIL,MOTRIN) 200 MG tablet Take 200 mg by mouth every 6 (six) hours as needed for mild pain or moderate pain.    Yes Historical Provider, MD  amoxicillin (AMOXIL) 500 MG capsule Take 1 capsule (500 mg total) by mouth 3 (three) times daily. Patient not taking: Reported on 01/03/2014 10/22/13   Emilia BeckKaitlyn Szekalski, PA-C  cephALEXin (KEFLEX) 500 MG capsule Take 1 capsule (500 mg total) by mouth 4 (four) times daily. Patient not taking: Reported on 01/03/2014 06/30/13   Marissa  Sciacca, PA-C  diphenoxylate-atropine (LOMOTIL) 2.5-0.025 MG per tablet Take 1 tablet by mouth 4 (four) times daily as needed for diarrhea or loose stools. Patient not taking: Reported on 01/03/2014 10/22/13   Emilia BeckKaitlyn Szekalski, PA-C  HYDROcodone-acetaminophen (NORCO/VICODIN) 5-325 MG per tablet Take 1 tablet by mouth every 6 (six) hours as needed for moderate pain. Patient not taking: Reported on 04/12/2014 01/03/14   Jamesetta Orleanshristopher W Lawyer, PA-C  ondansetron (ZOFRAN-ODT) 8 MG disintegrating tablet Take 1 tablet (8 mg total) by mouth every 8 (eight) hours as needed. 4mg  ODT q4 hours prn nausea/vomit Patient not taking: Reported on 04/12/2014 12/30/13   Kathrynn Speedobyn M Hess, PA-C  oxyCODONE-acetaminophen (PERCOCET) 5-325 MG per tablet Take 1-2 tablets by mouth every 6 (six) hours as needed for severe pain. Patient not taking: Reported on 04/12/2014 12/30/13   Kathrynn Speedobyn M Hess, PA-C  promethazine (PHENERGAN) 25 MG tablet Take 1 tablet (25 mg total) by mouth every 8 (eight) hours as needed for nausea or vomiting. Patient not taking: Reported on 04/12/2014 01/03/14   Jamesetta Orleanshristopher W Lawyer, PA-C   BP 139/67 mmHg  Pulse 87  Temp(Src) 97.6 F (36.4 C) (Oral)  Resp 16  Ht 5\' 9"  (1.753 m)  Wt 165 lb (74.844 kg)  BMI 24.36 kg/m2  SpO2 100% Physical Exam  Constitutional: He is oriented to person, place, and time. He appears well-developed and well-nourished. No distress.  Musculoskeletal:  FROM all fingers and both wrists.  Neurological: He is alert and oriented to person, place, and  time.  Skin:  Rash to hands bilaterally without blistering that affects palmar aspects only. The rash stops at thenar hand and does not affect volar wrists. ROM limited by pain bilaterally. No abnormalities of dorsal hands.  Psychiatric: He has a normal mood and affect.    ED Course  Procedures (including critical care time) Labs Review Labs Reviewed - No data to display  Imaging Review No results found.   EKG Interpretation None       MDM   Final diagnoses:  None    1. Chemical reaction  No blistering, drainage or significant swelling. Suspect chemical reaction given history. Prednisone, Bendaryl, pain medication. Strongly encouraged recheck tomorrow.     Arnoldo Hooker, PA-C 04/12/14 2302  Toy Baker, MD 04/12/14 [phone]

## 2015-02-07 ENCOUNTER — Encounter (HOSPITAL_COMMUNITY): Payer: Self-pay

## 2015-02-07 ENCOUNTER — Emergency Department (HOSPITAL_COMMUNITY): Payer: Self-pay

## 2015-02-07 ENCOUNTER — Emergency Department (HOSPITAL_COMMUNITY)
Admission: EM | Admit: 2015-02-07 | Discharge: 2015-02-07 | Disposition: A | Discharge status: Home / Self Care | Payer: Self-pay | Attending: Emergency Medicine | Admitting: Emergency Medicine

## 2015-02-07 DIAGNOSIS — R0602 Shortness of breath: Secondary | ICD-10-CM | POA: Insufficient documentation

## 2015-02-07 DIAGNOSIS — R079 Chest pain, unspecified: Secondary | ICD-10-CM | POA: Insufficient documentation

## 2015-02-07 DIAGNOSIS — Z87891 Personal history of nicotine dependence: Secondary | ICD-10-CM | POA: Insufficient documentation

## 2015-02-07 DIAGNOSIS — R05 Cough: Secondary | ICD-10-CM | POA: Insufficient documentation

## 2015-02-07 DIAGNOSIS — R0789 Other chest pain: Secondary | ICD-10-CM

## 2015-02-07 DIAGNOSIS — Z7952 Long term (current) use of systemic steroids: Secondary | ICD-10-CM | POA: Insufficient documentation

## 2015-02-07 LAB — BASIC METABOLIC PANEL
Anion gap: 8 (ref 5–15)
BUN: 20 mg/dL (ref 6–20)
CO2: 26 mmol/L (ref 22–32)
Calcium: 9.4 mg/dL (ref 8.9–10.3)
Chloride: 103 mmol/L (ref 101–111)
Creatinine, Ser: 1.15 mg/dL (ref 0.61–1.24)
GFR calc non Af Amer: >60 mL/min (ref >60)
Glucose, Bld: 91 mg/dL (ref 65–99)
POTASSIUM: 4.1 mmol/L (ref 3.5–5.1)
SODIUM: 137 mmol/L (ref 135–145)

## 2015-02-07 LAB — CBC
HEMATOCRIT: 42.7 % (ref 39.0–52.0)
Hemoglobin: 14.7 g/dL (ref 13.0–17.0)
MCH: 30.4 pg (ref 26.0–34.0)
MCHC: 34.4 g/dL (ref 30.0–36.0)
MCV: 88.2 fL (ref 78.0–100.0)
PLATELETS: 273 10*3/uL (ref 150–400)
RBC: 4.84 MIL/uL (ref 4.22–5.81)
RDW: 12.6 % (ref 11.5–15.5)
WBC: 7.9 10*3/uL (ref 4.0–10.5)

## 2015-02-07 LAB — I-STAT TROPONIN, ED: Troponin i, poc: 0 ng/mL (ref 0.00–0.08)

## 2015-02-07 NOTE — ED Notes (Signed)
Pt arrived via POV c/o sharp, central chest pain for the last 6 weeks.  Intermittently with SOB.

## 2015-02-07 NOTE — Discharge Instructions (Signed)
Chest Wall Pain Chest wall pain is pain in or around the bones and muscles of your chest. Sometimes, an injury causes this pain. Sometimes, the cause may not be known. This pain may take several weeks or longer to get better. HOME CARE Pay attention to any changes in your symptoms. Take these actions to help with your pain:  Rest as told by your doctor.  Avoid activities that cause pain. Try not to use your chest, belly (abdominal), or side muscles to lift heavy things.  If directed, apply ice to the painful area:  Put ice in a plastic bag.  Place a towel between your skin and the bag.  Leave the ice on for 20 minutes, 2-3 times per day.  Take over-the-counter and prescription medicines only as told by your doctor.  Do not use tobacco products, including cigarettes, chewing tobacco, and e-cigarettes. If you need help quitting, ask your doctor.  Keep all follow-up visits as told by your doctor. This is important. GET HELP IF:  You have a fever.  Your chest pain gets worse.  You have new symptoms. GET HELP RIGHT AWAY IF:  You feel sick to your stomach (nauseous) or you throw up (vomit).  You feel sweaty or light-headed.  You have a cough with phlegm (sputum) or you cough up blood.  You are short of breath.   This information is not intended to replace advice given to you by your health care provider. Make sure you discuss any questions you have with your health care provider.   Document Released: 08/02/2007 Document Revised: 11/04/2014 Document Reviewed: 05/11/2014 Elsevier Interactive Patient Education 2016 [employer]. Tonight your evaluation for chest pain rules out cardiac etiology  Please make an appointment with a PCP for further evaluation     Emergency Department Resource Guide 1) Find a Doctor and Pay Out of Pocket Although you won't have to find out who is covered by your insurance plan, it is a good idea to ask around and get recommendations. You will then  need to call the office and see if the doctor you have chosen will accept you as a new patient and what types of options they offer for patients who are self-pay. Some doctors offer discounts or will set up payment plans for their patients who do not have insurance, but you will need to ask so you aren't surprised when you get to your appointment.  2) Contact Your Local Health Department Not all health departments have doctors that can see patients for sick visits, but many do, so it is worth a call to see if yours does. If you don't know where your local health department is, you can check in your phone book. The CDC also has a tool to help you locate your state's health department, and many state websites also have listings of all of their local health departments.  3) Find a Walk-in Clinic If your illness is not likely to be very severe or complicated, you may want to try a walk in clinic. These are popping up all over the country in pharmacies, drugstores, and shopping centers. They're usually staffed by nurse practitioners or physician assistants that have been trained to treat common illnesses and complaints. They're usually fairly quick and inexpensive. However, if you have serious medical issues or chronic medical problems, these are probably not your best option.  No Primary Care Doctor: - Call Health Connect at  [phone] - they can help you locate a primary care doctor  that  accepts your insurance, provides certain services, etc. - Physician Referral Service- [phone]  Chronic Pain Problems: Organization         Address  Phone   Notes  [facility]  [phone] Patients need to be referred by their primary care doctor.   Medication Assistance: Organization         Address  Phone   Notes  [facility] Medication [facility] [address] [place] [place], [place] [address] [phone] --Must be a resident of [facility] -- Must have NO  insurance coverage whatsoever (no Medicaid/ Medicare, etc.) -- The pt. MUST have a primary care doctor that directs their care regularly and follows them in the community   [facility]ist  [phone]   [employer]  [phone]    Agencies that provide inexpensive medical care: Organization         Address  Phone   Notes  [facility]  [phone]   [facility]    [phone]   [facility] [address] [place], [place] [address] [phone]   [facility] [address] [place] [place] [phone]   Planned Parenthood    [phone]   [facility]    [phone]   [facility] and [facility]  [address], [place] Phone:  [phone], Fax:  [phone] Hours of Operation:  9 am - 6 pm, M-F.  Also accepts Medicaid/Medicare and self-pay.  [facility]  [address], [place] Phone: [phone], Fax: [phone]. Hours of Operation:  8:30 am - 5:30 pm, M-F.  Also accepts Medicaid and self-pay.  [facility] [place] [address], [place] Phone: [phone]   [facility] [address] Natasha Bence [place], [place] [phone], Ext. [phone] Mondays & Thursdays: 7-9 AM.  First 15 patients are seen on a first come, first serve basis.    Medicaid-accepting [facility] Providers:  Organization         Address  Phone   Notes  [facility] [address], [place] [phone] Also accepts self-pay patients.  [facility] [address] Laurell Josephs [place], [place]  [phone]   [facility] [address], [place] [phone]   [facility] [address] [place] [phone]   Renaye Rakers [address], [place]   [phone] Only accepts [place] Access [place] patients after they have  their name applied to their card.   Self-Pay (no insurance) in [facility]:  Organization         Address  Phone   Notes  Sickle Cell Patients, [facility] [address] [place], [place] [phone]   [facility] [address] [place], [place] [phone]   [facility] [place]  [address], [place] [phone]   [facility]/Dr. Osei-Bonsu  [address], [place] or [address], [place] [phone] Phone number for both [place] and [place] locations is the same.  [facility] [address] [place] [phone]   [facility] [address] [place] or [address] [phone]  [facility] [place] [phone], phone; [phone], fax Sees patients 1st and 3rd Saturday of every month.  Must not qualify for public or private insurance (i.e. Medicaid, Medicare, [place] Health Choice, Veterans' Benefits)  Household income should be no more than 200% of the poverty level The clinic cannot treat you if you are pregnant or think you are pregnant  Sexually transmitted diseases are not treated at the clinic.    Dental Care: Organization         Address  Phone  Notes  [facility] Department of [facility] [place] [phone] Accepts children up to age 61 who are enrolled in [place] or [place]; pregnant women with a Medicaid card; and children who have applied for Medicaid or [place] Health Choice, but were declined, whose parents can pay a reduced fee at time of service.  [facility] Department of [facility]  [address], [place] [phone] Accepts children up to age 23 who are enrolled in [place] or [place]; pregnant women with a Medicaid card; and children who have applied  for Medicaid or [place] Health Choice, but were declined, whose parents can pay a reduced fee at time of service.  [place] PRO[facility]  [place] [phone] Patients are seen by appointment only. Walk-ins are not accepted. [place] will see patients 66 years of age and older. Monday - Tuesday (8am-5pm) Most Wednesdays (8:30-5pm) $30 per visit, cash only  [facility] Access PRO[facility]  [address], [facility] [phone] Patients are seen by appointment only. Walk-ins are not accepted. [place] will see patients 59 years of age and older. One Wednesday Evening (Monthly: Volunteer Based).  $30 per visit, cash only  [place]  [phone] for adults; Children under age 15, call Graduate Pediatric Dentistry at [phone]. Children aged 65-14, please call [phone] to request a pediatric application.  Dental services are provided in all areas of dental care including fillings, crowns and bridges, complete and partial dentures, implants, gum treatment, root canals, and extractions. Preventive care is also provided. Treatment is provided to both adults and children. Patients are selected via a lottery and there is often a waiting list.   [facility] [address], [place]  [phone] [email or web address]   [facility] [address] [place], [place] [phone], Ext. [phone] Second and Fourth Thursday of each month, opens at 6:30 AM; Clinic ends at 9 AM.  Patients are seen on a first-come first-served basis, and a limited number are seen during each clinic.   [facility]  [address] Hillard Danker [place], [place] [phone]   Eligibility Requirements You must have lived in [place], [place], or [place] counties for at least the last three months.   You cannot be eligible for state or federal sponsored [employer], including [employer],  [place], or [employer].   You generally cannot be eligible for healthcare insurance through your employer.    How to apply: Eligibility screenings are held every Tuesday and Wednesday afternoon from 1:00 pm until 4:00 pm. You do not need an appointment for the interview!  [facility] [address] [place], [place]   [place]  [place] Department  [place]  Department  [phone]    Behavioral Health Resources in the Community: Intensive Outpatient Programs Organization         Address  Phone  Notes  [place]. [address] [place], [place] [phone]   [facility] Outpatient [address], [place], [place]   ADS: Alcohol & Drug Svcs [address], [place], [place]   [place] [address],  [place], [place] or [phone]   Substance Abuse Resources Organization         Address  Phone  Notes  Alcohol and Drug Services  [phone]   [place]  [phone]   [facility] [place]   Chinita Pester  [phone]   Residential & Outpatient Substance Abuse Program  [phone]   Psychological Services Organization         Address  Phone  Notes  [facility]  [place]  [phone]   [place] [address] [place] or [phone]    Mobile Crisis Teams Organization         Address  Phone  Notes  [facility], Mobile Crisis Care Unit  [phone]   [facility]  [address] [place], [place]   [place] Levels [address] [place] [phone]    Self-Help/Support Groups Organization         Address  Phone             Notes  [place]. of [place] - variety of support  groups  [place] Call for more information  Narcotics Anonymous (NA), [facility] [address], [employer] [place]  2 meetings at this location   Special educational needs teacher         Address  Phone  Notes  ASAP Residential Treatment [place],    [place]  [phone]   [facility]  [address] [place] [phone], [place], [place]   Vader [place], [place] [phone] Admissions: 8am-3pm M-F  Incentives Substance [place] [address]    [place], [place] [phone]   [facility] [address] [place], [place], [place]   [facility] [address],  [place], [place]   [facility] - Intensive Outpatient [address] Kristeen Mans [address], [place], [place]   [facility] ([place] [place].,  [place], [place] [phone] or [phone]   [facility] ([facility]) [address], [place], [place] Accepts Medicaid  [facility] [address],  [place] [phone] Substance Abuse/Addiction Treatment   [facility] Organization         Address  Phone  Notes  [facility]  [phone]   Domenic Schwab, PhD [address] Arlis Porta [place], [place]   [phone] or [phone]   [place] Ruth [place], [place] [phone]   [place] [address], [place], [place] [phone] Insurance/Medicaid/sponsorship through [employer] and Families [address] [place]                                    [place], [place] [phone] [place]  [address]   [place], [place] [phone](336) [phone]    Dr. Lolly MustacheArfeen  [phone]) [phone]   [facility] [place] [facility]  [employer] [facility] [place] [facility] 1) [address], [place] 2) [address], [place] 3)   [address], [place] [phone]) [phone]  [phone]) [phone]   [facility] Child Abuse Hotline [phone](336) [phone] or [phone]) [phone] (After Hours)
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
## 2016-06-02 IMAGING — CR DG CHEST 2V
2 series · 2 of 2 positions shown · non-contrast
Comparison: 01/22/2015

CLINICAL DATA: Chest pain for 1 month. Shortness of breath with
exertion today.

EXAM:
CHEST  2 VIEW

[chest pa]
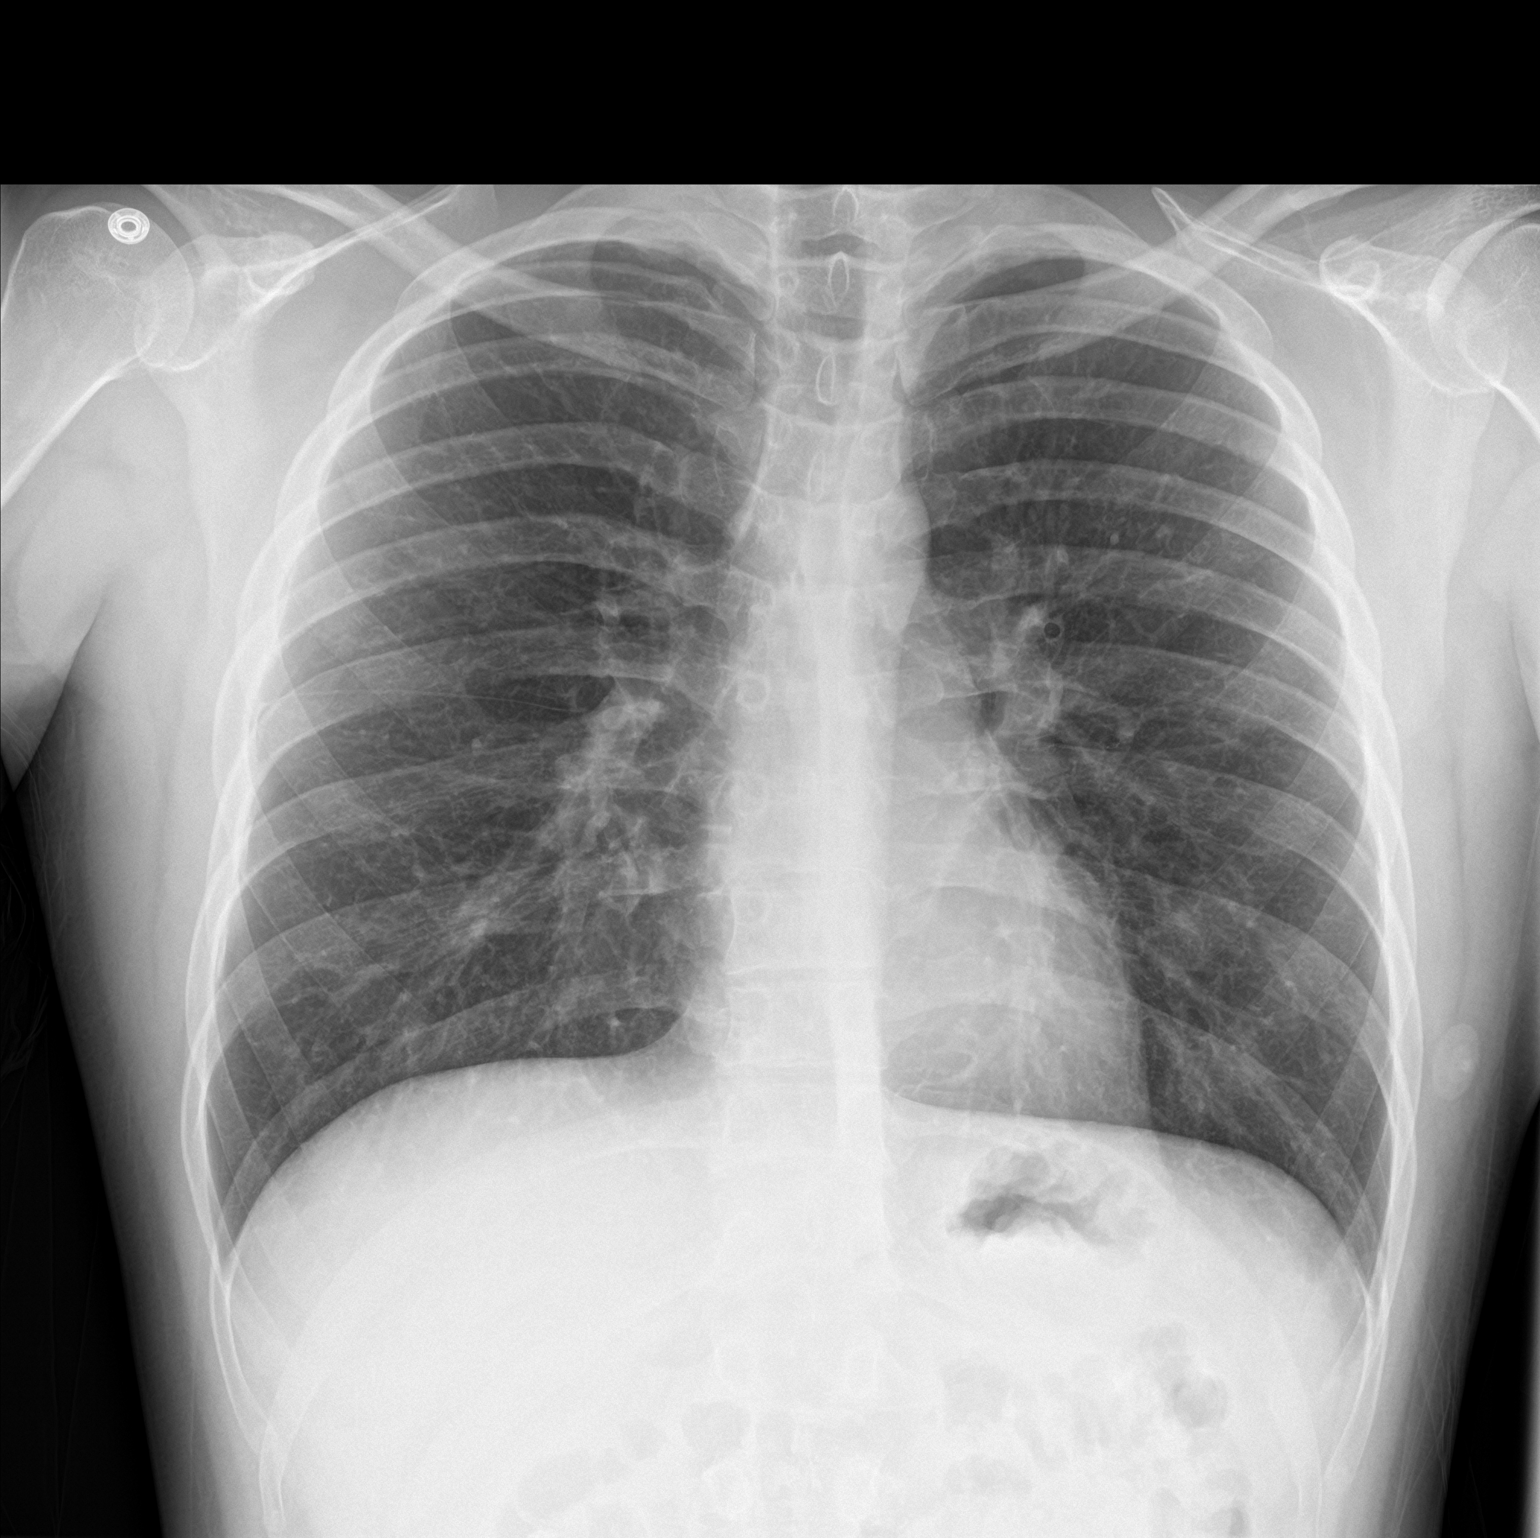

[chest lat]
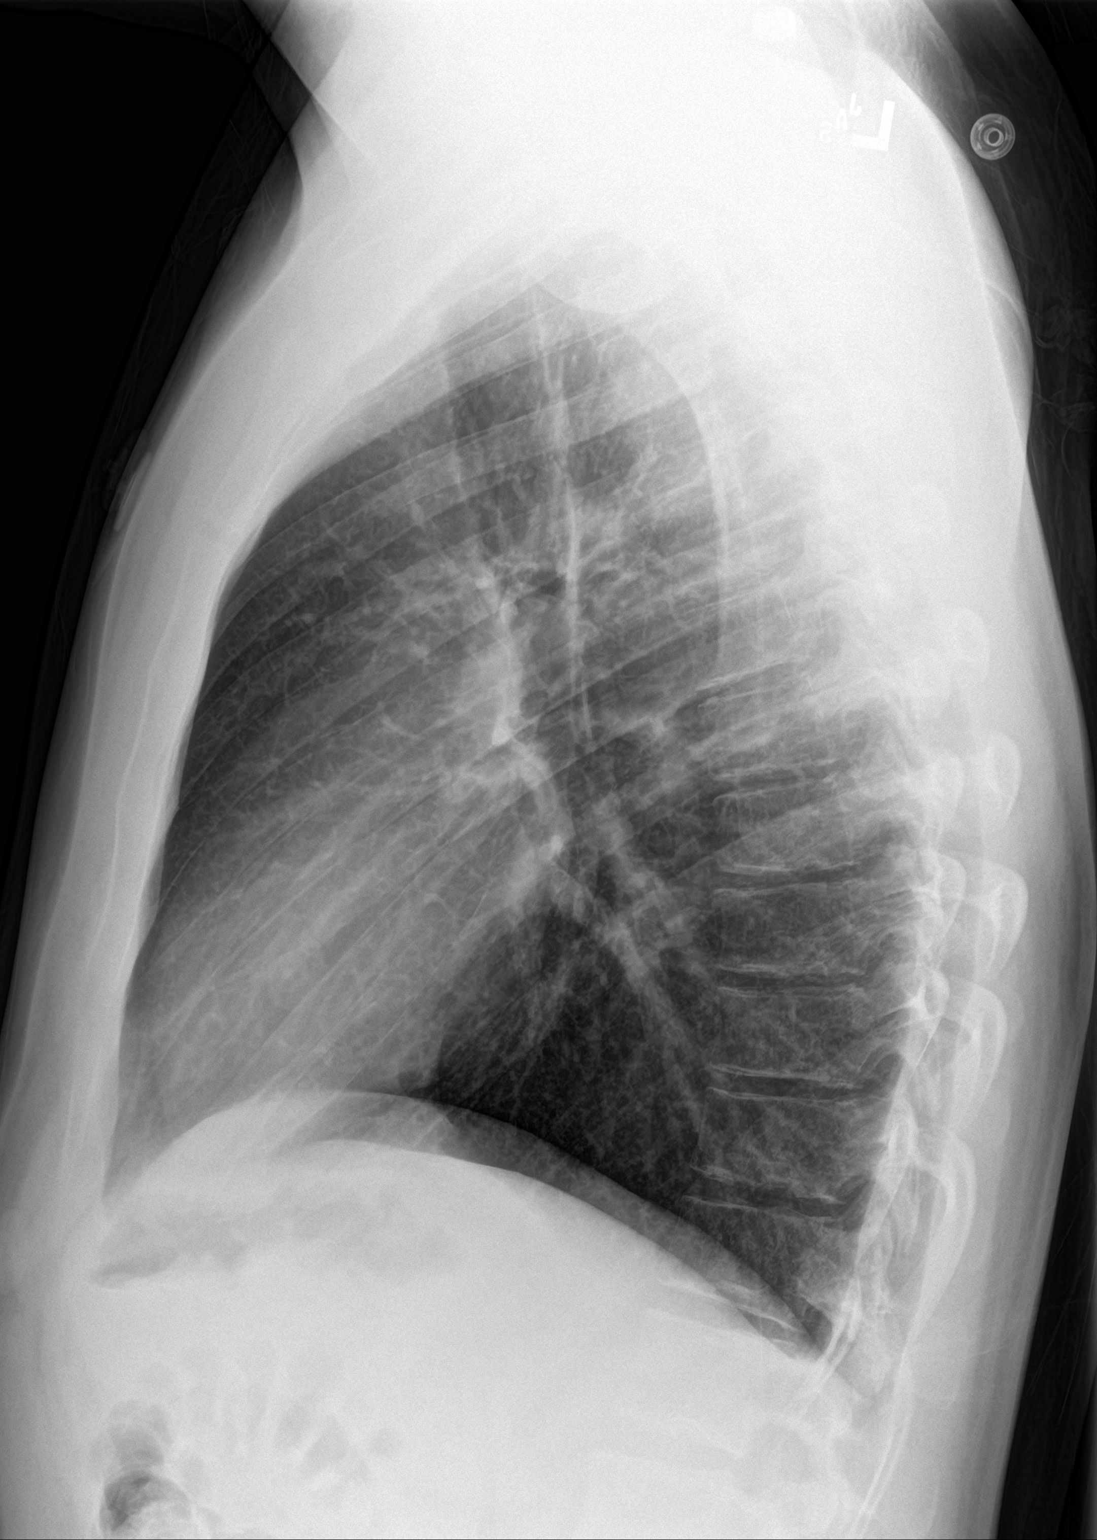

[2 of 2 positions shown; findings below may reference images not displayed]

FINDINGS: The cardiomediastinal contours are normal. The lungs are clear.
Pulmonary vasculature is normal. No consolidation, pleural effusion,
or pneumothorax. No acute osseous abnormalities are seen.
IMPRESSION: No acute pulmonary process.
